# Patient Record
Sex: Male | Born: 1935 | Race: White | Hispanic: No | Marital: Married | Smoking: Never smoker
Health system: Southern US, Community
[De-identification: ages and names within clinical notes are randomized; demographics above are authoritative.]

## PROBLEM LIST (undated history)

## (undated) DIAGNOSIS — A419 Sepsis, unspecified organism: Secondary | ICD-10-CM

## (undated) DIAGNOSIS — K219 Gastro-esophageal reflux disease without esophagitis: Secondary | ICD-10-CM

## (undated) DIAGNOSIS — E039 Hypothyroidism, unspecified: Secondary | ICD-10-CM

## (undated) DIAGNOSIS — G4730 Sleep apnea, unspecified: Secondary | ICD-10-CM

## (undated) DIAGNOSIS — Z950 Presence of cardiac pacemaker: Secondary | ICD-10-CM

## (undated) DIAGNOSIS — I10 Essential (primary) hypertension: Secondary | ICD-10-CM

## (undated) DIAGNOSIS — I499 Cardiac arrhythmia, unspecified: Secondary | ICD-10-CM

## (undated) DIAGNOSIS — Z95810 Presence of automatic (implantable) cardiac defibrillator: Secondary | ICD-10-CM

## (undated) DIAGNOSIS — I4891 Unspecified atrial fibrillation: Secondary | ICD-10-CM

## (undated) DIAGNOSIS — I509 Heart failure, unspecified: Secondary | ICD-10-CM

## (undated) DIAGNOSIS — I639 Cerebral infarction, unspecified: Secondary | ICD-10-CM

## (undated) HISTORY — PX: OTHER SURGICAL HISTORY: SHX169

---

## 2017-03-22 DIAGNOSIS — A419 Sepsis, unspecified organism: Secondary | ICD-10-CM

## 2017-03-22 HISTORY — DX: Sepsis, unspecified organism: A41.9

## 2017-03-28 ENCOUNTER — Ambulatory Visit (HOSPITAL_COMMUNITY): Payer: BLUE CROSS/BLUE SHIELD | Admitting: Registered Nurse

## 2017-03-28 ENCOUNTER — Encounter (HOSPITAL_COMMUNITY)
Admission: RE | Disposition: A | Discharge status: Home / Self Care | Payer: Self-pay | Source: Ambulatory Visit | Attending: Gastroenterology

## 2017-03-28 ENCOUNTER — Ambulatory Visit (HOSPITAL_COMMUNITY)
Admission: RE | Admit: 2017-03-28 | Discharge: 2017-03-28 | Disposition: A | Discharge status: Home / Self Care | Payer: BLUE CROSS/BLUE SHIELD | Source: Ambulatory Visit | Attending: Gastroenterology | Admitting: Gastroenterology

## 2017-03-28 ENCOUNTER — Other Ambulatory Visit: Payer: Self-pay | Admitting: Gastroenterology

## 2017-03-28 ENCOUNTER — Other Ambulatory Visit: Payer: Self-pay

## 2017-03-28 ENCOUNTER — Ambulatory Visit (HOSPITAL_COMMUNITY): Payer: BLUE CROSS/BLUE SHIELD

## 2017-03-28 ENCOUNTER — Encounter (HOSPITAL_COMMUNITY): Payer: Self-pay | Admitting: *Deleted

## 2017-03-28 DIAGNOSIS — E039 Hypothyroidism, unspecified: Secondary | ICD-10-CM | POA: Insufficient documentation

## 2017-03-28 DIAGNOSIS — I4891 Unspecified atrial fibrillation: Secondary | ICD-10-CM | POA: Diagnosis not present

## 2017-03-28 DIAGNOSIS — I472 Ventricular tachycardia: Secondary | ICD-10-CM | POA: Insufficient documentation

## 2017-03-28 DIAGNOSIS — I110 Hypertensive heart disease with heart failure: Secondary | ICD-10-CM | POA: Insufficient documentation

## 2017-03-28 DIAGNOSIS — G4730 Sleep apnea, unspecified: Secondary | ICD-10-CM | POA: Diagnosis not present

## 2017-03-28 DIAGNOSIS — Z950 Presence of cardiac pacemaker: Secondary | ICD-10-CM | POA: Insufficient documentation

## 2017-03-28 DIAGNOSIS — I509 Heart failure, unspecified: Secondary | ICD-10-CM | POA: Insufficient documentation

## 2017-03-28 DIAGNOSIS — K8030 Calculus of bile duct with cholangitis, unspecified, without obstruction: Secondary | ICD-10-CM | POA: Insufficient documentation

## 2017-03-28 DIAGNOSIS — Z79890 Hormone replacement therapy (postmenopausal): Secondary | ICD-10-CM | POA: Diagnosis not present

## 2017-03-28 DIAGNOSIS — K219 Gastro-esophageal reflux disease without esophagitis: Secondary | ICD-10-CM | POA: Diagnosis not present

## 2017-03-28 DIAGNOSIS — Z8673 Personal history of transient ischemic attack (TIA), and cerebral infarction without residual deficits: Secondary | ICD-10-CM | POA: Insufficient documentation

## 2017-03-28 DIAGNOSIS — K831 Obstruction of bile duct: Secondary | ICD-10-CM

## 2017-03-28 DIAGNOSIS — Z9889 Other specified postprocedural states: Secondary | ICD-10-CM | POA: Insufficient documentation

## 2017-03-28 DIAGNOSIS — Z6830 Body mass index (BMI) 30.0-30.9, adult: Secondary | ICD-10-CM | POA: Insufficient documentation

## 2017-03-28 DIAGNOSIS — Z95810 Presence of automatic (implantable) cardiac defibrillator: Secondary | ICD-10-CM | POA: Diagnosis not present

## 2017-03-28 DIAGNOSIS — Z79899 Other long term (current) drug therapy: Secondary | ICD-10-CM | POA: Insufficient documentation

## 2017-03-28 HISTORY — PX: ERCP: SHX5425

## 2017-03-28 HISTORY — DX: Gastro-esophageal reflux disease without esophagitis: K21.9

## 2017-03-28 HISTORY — DX: Cardiac arrhythmia, unspecified: I49.9

## 2017-03-28 HISTORY — DX: Unspecified atrial fibrillation: I48.91

## 2017-03-28 HISTORY — DX: Sleep apnea, unspecified: G47.30

## 2017-03-28 HISTORY — DX: Heart failure, unspecified: I50.9

## 2017-03-28 HISTORY — DX: Presence of automatic (implantable) cardiac defibrillator: Z95.810

## 2017-03-28 HISTORY — DX: Essential (primary) hypertension: I10

## 2017-03-28 HISTORY — DX: Presence of cardiac pacemaker: Z95.0

## 2017-03-28 HISTORY — DX: Cerebral infarction, unspecified: I63.9

## 2017-03-28 HISTORY — DX: Hypothyroidism, unspecified: E03.9

## 2017-03-28 HISTORY — DX: Sepsis, unspecified organism: A41.9

## 2017-03-28 SURGERY — ERCP, WITH INTERVENTION IF INDICATED
Anesthesia: General

## 2017-03-28 MED ORDER — SODIUM CHLORIDE 0.9 % IV SOLN
1.0000 g | Freq: Once | INTRAVENOUS | Status: AC
  Administered 2017-03-28: 1 g via INTRAVENOUS
  Filled 2017-03-28: qty 1

## 2017-03-28 MED ORDER — LIDOCAINE HCL (CARDIAC) 20 MG/ML IV SOLN
INTRAVENOUS | Status: DC | PRN
  Administered 2017-03-28: 20 mg via INTRAVENOUS

## 2017-03-28 MED ORDER — DEXAMETHASONE SODIUM PHOSPHATE 10 MG/ML IJ SOLN
INTRAMUSCULAR | Status: DC | PRN
  Administered 2017-03-28: 5 mg via INTRAVENOUS

## 2017-03-28 MED ORDER — HEPARIN SOD (PORK) LOCK FLUSH 100 UNIT/ML IV SOLN
250.0000 [IU] | INTRAVENOUS | Status: AC | PRN
  Administered 2017-03-28: 250 [IU]

## 2017-03-28 MED ORDER — SODIUM CHLORIDE 0.9 % IV SOLN
INTRAVENOUS | Status: DC
  Administered 2017-03-28: 14:00:00 via INTRAVENOUS

## 2017-03-28 MED ORDER — SUFENTANIL CITRATE 50 MCG/ML IV SOLN: INTRAVENOUS | Status: DC | PRN

## 2017-03-28 MED ORDER — ROCURONIUM BROMIDE 100 MG/10ML IV SOLN
INTRAVENOUS | Status: DC | PRN
  Administered 2017-03-28: 50 mg via INTRAVENOUS

## 2017-03-28 MED ORDER — ONDANSETRON HCL 4 MG/2ML IJ SOLN
INTRAMUSCULAR | Status: DC | PRN
  Administered 2017-03-28: 4 mg via INTRAVENOUS

## 2017-03-28 MED ORDER — LACTATED RINGERS IV SOLN
INTRAVENOUS | Status: DC
  Administered 2017-03-28: 1000 mL via INTRAVENOUS

## 2017-03-28 MED ORDER — PROPOFOL 10 MG/ML IV BOLUS
INTRAVENOUS | Status: DC | PRN
  Administered 2017-03-28: 100 mg via INTRAVENOUS

## 2017-03-28 MED ORDER — PROMETHAZINE HCL 25 MG/ML IJ SOLN: 6.2500 mg | INTRAMUSCULAR | Status: DC | PRN

## 2017-03-28 MED ORDER — PHENYLEPHRINE HCL 10 MG/ML IJ SOLN
INTRAVENOUS | Status: DC | PRN
  Administered 2017-03-28: 25 ug/min via INTRAVENOUS

## 2017-03-28 MED ORDER — SUGAMMADEX SODIUM 200 MG/2ML IV SOLN
INTRAVENOUS | Status: DC | PRN
  Administered 2017-03-28: 200 mg via INTRAVENOUS

## 2017-03-28 MED ORDER — MEPERIDINE HCL 25 MG/ML IJ SOLN: 6.2500 mg | INTRAMUSCULAR | Status: DC | PRN

## 2017-03-28 MED ORDER — FENTANYL CITRATE (PF) 100 MCG/2ML IJ SOLN
INTRAMUSCULAR | Status: AC
  Filled 2017-03-28: qty 2

## 2017-03-28 MED ORDER — PROPOFOL 10 MG/ML IV BOLUS
INTRAVENOUS | Status: AC
  Filled 2017-03-28: qty 20

## 2017-03-28 MED ORDER — INDOMETHACIN 50 MG RE SUPP
RECTAL | Status: AC
  Filled 2017-03-28: qty 2

## 2017-03-28 MED ORDER — GLUCAGON HCL RDNA (DIAGNOSTIC) 1 MG IJ SOLR
INTRAMUSCULAR | Status: AC
  Filled 2017-03-28: qty 1

## 2017-03-28 MED ORDER — MIDAZOLAM HCL 5 MG/ML IJ SOLN
0.5000 mg | Freq: Once | INTRAMUSCULAR | Status: DC | PRN
Start: 2017-03-28 — End: 2017-03-28

## 2017-03-28 MED ORDER — FENTANYL CITRATE (PF) 100 MCG/2ML IJ SOLN
INTRAMUSCULAR | Status: DC | PRN
  Administered 2017-03-28: 25 ug via INTRAVENOUS
  Administered 2017-03-28: 50 ug via INTRAVENOUS
  Administered 2017-03-28: 25 ug via INTRAVENOUS

## 2017-03-28 MED ORDER — SODIUM CHLORIDE 0.9 % IV SOLN
INTRAVENOUS | Status: DC | PRN
  Administered 2017-03-28: 50 mL

## 2017-03-28 NOTE — Transfer of Care (Signed)
Immediate Anesthesia Transfer of Care Note  Patient: Drew RoughRobert M Drake  Procedure(s) Performed: ENDOSCOPIC RETROGRADE CHOLANGIOPANCREATOGRAPHY (ERCP) (N/A )  Patient Location: PACU  Anesthesia Type:General  Level of Consciousness: awake, alert , oriented and patient cooperative  Airway & Oxygen Therapy: Patient Spontanous Breathing and Patient connected to face mask oxygen  Post-op Assessment: Report given to RN, Post -op Vital signs reviewed and stable and Patient moving all extremities X 4  Post vital signs: stable  Last Vitals:  Vitals:   03/28/17 1046 03/28/17 1413  BP: (!) 167/91 (!) 170/89  Pulse: 70 73  Resp: 18 18  Temp: 36.5 C   SpO2: 96% 97%    Last Pain:  Vitals:   03/28/17 1413  TempSrc: Oral         Complications: No apparent anesthesia complications

## 2017-03-28 NOTE — Anesthesia Procedure Notes (Signed)
Procedure Name: Intubation Date/Time: 03/28/2017 12:26 PM Performed by: Lissa Morales, CRNA Pre-anesthesia Checklist: Patient identified, Emergency Drugs available, Suction available and Patient being monitored Patient Re-evaluated:Patient Re-evaluated prior to induction Oxygen Delivery Method: Circle system utilized Preoxygenation: Pre-oxygenation with 100% oxygen Induction Type: IV induction Ventilation: Mask ventilation without difficulty Laryngoscope Size: Glidescope, 3 and Mac Grade View: Grade II Tube type: Oral Tube size: 7.5 mm Number of attempts: 1 Airway Equipment and Method: Stylet and Oral airway Placement Confirmation: ETT inserted through vocal cords under direct vision,  positive ETCO2 and breath sounds checked- equal and bilateral Secured at: 21 cm Tube secured with: Tape Dental Injury: Teeth and Oropharynx as per pre-operative assessment  Difficulty Due To: Difficulty was unanticipated, Difficult Airway- due to anterior larynx, Difficult Airway- due to limited oral opening, Difficult Airway- due to dentition and Difficult Airway- due to reduced neck mobility Comments: Elective glidescope,Stated difficulty swallowing after  GA procedure a few days ago

## 2017-03-28 NOTE — Op Note (Signed)
[facility] Patient Name: Drew DuttonRobert Drake Procedure Date: 03/28/2017 MRN: [id number] Attending MD: Vida RiggerMarc Evalina Tabak , MD Date of Birth: 08/24/1929 CSN: [id number] Age: 84 Admit Type: Outpatient Procedure:                ERCP Indications:              Ascending cholangitis Providers:                Vida RiggerMarc Linzie Criss, MD, Autumn Titus DubinGoldsmith RN, RN, Kandice RobinsonsGuillaume                            [place], Technician Referring MD:              Medicines:                General Anesthesia Complications:            No immediate complications. Estimated Blood Loss:     Estimated blood loss: none. Procedure:                Pre-Anesthesia Assessment:                           - Prior to the procedure, a History and Physical                            was performed, and patient medications and                            allergies were reviewed. The patient's tolerance of                            previous anesthesia was also reviewed. The risks                            and benefits of the procedure and the sedation                            options and risks were discussed with the patient.                            All questions were answered, and informed consent                            was obtained. Prior Anticoagulants: The patient has                            taken no previous anticoagulant or antiplatelet                            agents. ASA Grade Assessment: III - A patient with                            severe systemic disease. After reviewing the risks                            and benefits, the  patient was deemed in                            satisfactory condition to undergo the procedure.                           After obtaining informed consent, the scope was                            passed under direct vision. Throughout the                            procedure, the patient's blood pressure, pulse, and                            oxygen saturations were monitored continuously.  The                            was introduced through the mouth, and used to                            inject contrast into and used to cannulate the bile                            duct. The ERCP was technically difficult and                            complex due to abnormal anatomy. Successful                            completion of the procedure was aided by performing                            the maneuvers documented (below) in this report.                            The patient tolerated the procedure well. Scope In: Scope Out: Findings:      The major papilla was located entirely within a diverticulum. We were       unable to find the ostia and we did probe the ampulla multiple times and       tried to use the sphincterotome to change its position but we did see       bile spurt a few times which confirmed our anatomy and we proceeded with       a A biliary pre-cut sphincterotomy was made with a needle knife using a       freehand technique using ERBE electrocautery. Deep selective cannulation       was readily obtained and the wire was advanced into the intrahepatics       and dye was injected which confirmed sludge and stones and a dilated       CBD. There was no post-sphincterotomy bleeding. We then exchanged the       needle-knife for the customary sphincterotome and The biliary       sphincterotomy was extended with a Hydratome  sphincterotome using ERBE       electrocautery. There was no post-sphincterotomy bleeding. The common       bile duct was moderately dilated, with a stone causing an obstruction.       The largest duct diameter was 15 mm. The common bile duct contained       multiple stones and debris the largest of which was 10 mm in diameter.       To discover objects, the biliary tree was swept with an adjustable12- 15       mm balloon starting at the bifurcation. Sludge was swept from the duct.       All stones were removed by the end of the procedure. Nothing  was found       on multiple balloon pull-through second of the procedure as well as on       occlusion cholangiogram. Dilation of the common bile duct with a 4 cm by       10 mm balloon dilator was successful. Near the end of the procedure       because of decreased biliary drainage to remove the largest stone which       could not be swept from the duct we advanced the 2.5 cm basket starting       at the middle third of the main bile duct. And the stone was grabbed and       removed Sludge was also swept from the duct. A few stones were removed.       A few stones remained. We then swept the duct a few more times and after       the dilation the 15 mm balloon passed readily through the duct without       much resistance and no residual filling defects on occlusion       cholangiogram at the end of the procedure Impression:               - The major papilla was located entirely within a                            diverticulum.                           - The common bile duct was moderately dilated, with                            a stone causing an obstruction.                           - Choledocholithiasis was found. Partial removal                            was accomplished by biliary sphincterotomy; no                            stent was inserted.                           - A needle-knife biliary sphincterotomy was                            performed.                           -  A conventional biliary sphincterotomy was also                            performed. As above                           - The biliary tree was swept and nothing was found                            at the end of the procedure.                           - Common bile duct was successfully dilated.                           - The biliary tree was swept with multiple 12 and                            15 mm balloon pull-throughs. As above Moderate Sedation:      N/A- Per Anesthesia Care Recommendation:            - Avoid aspirin and nonsteroidal anti-inflammatory                            medicines for 3 days.                           - Clear liquid diet for 6 hours.                           - Continue present medications.                           - Return to GI clinic PRN.                           - Telephone GI clinic if symptomatic PRN.                           - Check liver enzymes (AST, ALT, alkaline                            phosphatase, bilirubin) in 2 days. Will also                            recheck CBC and liver function and will discuss                            antibiotic choice with infectious disease Procedure Code(s):        --- Professional ---                           [phone], 59, Endoscopic retrograde                            cholangiopancreatography (ERCP);  with                            trans-endoscopic balloon dilation of                            biliary/pancreatic duct(s) or of ampulla                            (sphincteroplasty), including sphincterotomy, when                            performed, each duct                           43264, Endoscopic retrograde                            cholangiopancreatography (ERCP); with removal of                            calculi/debris from biliary/pancreatic duct(s) Diagnosis Code(s):        --- Professional ---                           K80.31, Calculus of bile duct with cholangitis,                            unspecified, with obstruction                           K80.30, Calculus of bile duct with cholangitis,                            unspecified, without obstruction                           K83.0, Cholangitis CPT copyright 2016 American [employer]. All rights reserved. The codes documented in this report are preliminary and upon coder review may  be revised to meet current compliance requirements. Vida Rigger, MD 03/28/2017 2:21:12 PM This report has been signed electronically. Number of Addenda:  0

## 2017-03-28 NOTE — Progress Notes (Signed)
Cbc 03-22-17, bmet 03-26-17 chest xray 1 view 03-25-17 on chart

## 2017-03-28 NOTE — Discharge Instructions (Signed)
Call if question or problem otherwise clear liquid diet today and if doing well at 8 PM may have soft solids otherwise soft solids tomorrow if okay and may advance on Friday and call my nurse Britta MccreedyBarbara to set up repeat lab work on Friday morning and specifically call if signs of GI bleeding black diarrhea fever pain nausea vomiting etc.  YOU HAD AN ENDOSCOPIC PROCEDURE TODAY: Refer to the procedure report and other information in the discharge instructions given to you for any specific questions about what was found during the examination. If this information does not answer your questions, please call [facility] office at [phone] to clarify.   YOU SHOULD EXPECT: Some feelings of bloating in the abdomen. Passage of more gas than usual. Walking can help get rid of the air that was put into your GI tract during the procedure and reduce the bloating. If you had a lower endoscopy (such as a colonoscopy or flexible sigmoidoscopy) you may notice spotting of blood in your stool or on the toilet paper. Some abdominal soreness may be present for a day or two, also.  DIET: Your first meal following the procedure should be a light meal and then it is ok to progress to your normal diet. A half-sandwich or bowl of soup is an example of a good first meal. Heavy or fried foods are harder to digest and may make you feel nauseous or bloated. Drink plenty of fluids but you should avoid alcoholic beverages for 24 hours. If you had a esophageal dilation, please see attached instructions for diet.   ACTIVITY: Your care partner should take you home directly after the procedure. You should plan to take it easy, moving slowly for the rest of the day. You can resume normal activity the day after the procedure however YOU SHOULD NOT DRIVE, use power tools, machinery or perform tasks that involve climbing or major physical exertion for 24 hours (because of the sedation medicines used during the test).   SYMPTOMS TO REPORT  IMMEDIATELY: A gastroenterologist can be reached at any hour. Please call [phone]  for any of the following symptoms:  Following lower endoscopy (colonoscopy, flexible sigmoidoscopy) Excessive amounts of blood in the stool  Significant tenderness, worsening of abdominal pains  Swelling of the abdomen that is new, acute  Fever of 100 or higher  Following upper endoscopy (EGD, EUS, ERCP, esophageal dilation) Vomiting of blood or coffee ground material  New, significant abdominal pain  New, significant chest pain or pain under the shoulder blades  Painful or persistently difficult swallowing  New shortness of breath  Black, tarry-looking or red, bloody stools  FOLLOW UP:  If any biopsies were taken you will be contacted by phone or by letter within the next 1-3 weeks. Call [phone]  if you have not heard about the biopsies in 3 weeks.  Please also call with any specific questions about appointments or follow up tests.

## 2017-03-28 NOTE — Anesthesia Preprocedure Evaluation (Addendum)
Anesthesia Evaluation  Patient identified by MRN, date of birth, ID band Patient awake    Reviewed: Allergy & Precautions, NPO status , Patient's Chart, lab work & pertinent test results, reviewed documented beta blocker date and time   History of Anesthesia Complications Negative for: history of anesthetic complications  Airway Mallampati: II  TM Distance: >3 FB Neck ROM: Full    Dental  (+) Caps, Dental Advisory Given   Pulmonary sleep apnea and Continuous Positive Airway Pressure Ventilation ,    breath sounds clear to auscultation       Cardiovascular hypertension, Pt. on medications and Pt. on home beta blockers (-) angina(-) CAD + dysrhythmias Atrial Fibrillation + pacemaker + Cardiac Defibrillator (biotronic, has not shocked patient since insertion)  Rhythm:Irregular Rate:Normal + Systolic murmurs 78/29/5612/28/18 ECHO: EF 35%, mild-mod aortic stenosis, mild-mod reduced RV function  Pt reports cardiac cath x2: no blockages   Neuro/Psych CVA (R sided weakness), Residual Symptoms    GI/Hepatic GERD  Controlled,Elevated LFTs with acute chole   Endo/Other  Hypothyroidism Morbid obesity  Renal/GU negative Renal ROS     Musculoskeletal  (+) Arthritis ,   Abdominal (+) + obese,   Peds  Hematology   Anesthesia Other Findings   Reproductive/Obstetrics                            Anesthesia Physical Anesthesia Plan  ASA: III  Anesthesia Plan: General   Post-op Pain Management:    Induction: Intravenous  PONV Risk Score and Plan: Treatment may vary due to age or medical condition, Ondansetron and Dexamethasone  Airway Management Planned: Oral ETT  Additional Equipment:   Intra-op Plan:   Post-operative Plan: Extubation in OR  Informed Consent: I have reviewed the patients History and Physical, chart, labs and discussed the procedure including the risks, benefits and alternatives for the  proposed anesthesia with the patient or authorized representative who has indicated his/her understanding and acceptance.   Dental advisory given  Plan Discussed with: CRNA and Surgeon  Anesthesia Plan Comments: (Plan routine monitors, GETA)        Anesthesia Quick Evaluation

## 2017-03-28 NOTE — H&P (Signed)
Drew Drake is an 84 y.o. male.   Chief Complaint: Resolving cholangitis HPI: Patient known to me from being the father of a good friend who had his gallbladder out 15 years ago which was an open procedure and while on vacation developed heart failure and sepsis and was found to have elevated liver tests and probable CBD stones or sludge on CT scan and there were unable to do an MRCP due to his pacemaker defibrillator and unfortunately an ERCP was unsuccessful and they debated whether to do a PTC or not but when he got better on antibiotics he was decided to continue antibiotics and retry the procedure here and he has not had any abdominal pain and is feeling much better and shortness of breath has resolved and he has no other complaints  Past Medical History:  Diagnosis Date  . AICD (automatic cardioverter/defibrillator) present   . Atrial fibrillation ([place])   . CHF (congestive heart failure) ([place])   . Dysrhythmia    afib and ventricular tachycardia  . GERD (gastroesophageal reflux disease)   . Hypertension   . Hypothyroidism   . Presence of permanent cardiac pacemaker   . Sepsis ([place]) 03/22/2017  . Sleep apnea   . Stroke [facility])     Past Surgical History:  Procedure Laterality Date  . ablation for afib      History reviewed. No pertinent family history. Social History:  reports that  has never smoked. he has never used smokeless tobacco. He reports that he drinks alcohol. He reports that he does not use drugs.  Allergies: No Known Allergies  Medications Prior to Admission  Medication Sig Dispense Refill  . albuterol (PROVENTIL HFA;VENTOLIN HFA) 108 (90 Base) MCG/ACT inhaler Inhale 2 puffs into the lungs every 6 (six) hours as needed for wheezing or shortness of breath.    . bisoprolol (ZEBETA) 5 MG tablet Take 5 mg by mouth 2 (two) times daily.    . Cholecalciferol 1000 units tablet Take 1,000 Units by mouth daily.    . cyanocobalamin 100 MCG tablet Take 100 mcg by mouth every  evening.    Marland Kitchen esomeprazole (NEXIUM) 20 MG capsule Take 10 mg by mouth daily at 12 noon.    . flecainide (TAMBOCOR) 50 MG tablet Take 50 mg by mouth 2 (two) times daily.    Javier Docker Oil 300 MG CAPS Take 600 mg by mouth 2 (two) times daily.    Marland Kitchen MAGNESIUM GLYCINATE PLUS PO Take 1,000 mg by mouth 2 (two) times daily.    Marland Kitchen mexiletine (MEXITIL) 200 MG capsule Take 200 mg by mouth 2 (two) times daily.    . Multiple Vitamin (MULTIVITAMIN WITH MINERALS) TABS tablet Take 1 tablet by mouth daily.    . sildenafil (VIAGRA) 50 MG tablet Take 50 mg by mouth as needed for erectile dysfunction.    . simvastatin (ZOCOR) 5 MG tablet Take 5 mg by mouth at bedtime.    Marland Kitchen spironolactone (ALDACTONE) 25 MG tablet Take 25 mg by mouth daily.    Marland Kitchen thyroid (ARMOUR) 32.5 MG tablet Take 32.5 mg by mouth daily.    . valsartan (DIOVAN) 40 MG tablet Take 40 mg by mouth daily.      No results found for this or any previous visit (from the past 48 hour(s)). No results found.  ROS negative except above we did discuss his [place] admission on his last trip to [place]  Blood pressure (!) 167/91, pulse 70, temperature 97.7 F (36.5 C), temperature source  Oral, resp. rate 18, height 5' 9.5" (1.765 m), weight 95.3 kg (210 lb), SpO2 96 %. Physical Exam vital signs stable afebrile no acute distress exam please see preassessment evaluation yesterday's labs reviewed mildly elevated liver tests with normal bili and white count other records reviewed  Assessment/Plan Probable resolving cholangitis secondary to CBD sludge or stone Plan: The risks benefits methods and success rate of ERCP was discussed with the patient including possible stenting and will proceed with anesthesia assistance with further workup plans and recommendations pending those findings  [facility], MD 03/28/2017, 11:26 AM

## 2017-03-28 NOTE — Anesthesia Postprocedure Evaluation (Signed)
Anesthesia Post Note  Patient: Drew Drake  Procedure(s) Performed: ENDOSCOPIC RETROGRADE CHOLANGIOPANCREATOGRAPHY (ERCP) (N/A )     Patient location during evaluation: Endoscopy Anesthesia Type: General Level of consciousness: awake and alert, patient cooperative and oriented Pain management: pain level controlled Vital Signs Assessment: post-procedure vital signs reviewed and stable Respiratory status: spontaneous breathing, nonlabored ventilation and respiratory function stable Cardiovascular status: blood pressure returned to baseline and stable Postop Assessment: no apparent nausea or vomiting Anesthetic complications: no    Last Vitals:  Vitals:   03/28/17 1435 03/28/17 1440  BP:  (!) 161/87  Pulse:  70  Resp:  17  Temp:    SpO2: 96% 94%    Last Pain:  Vitals:   03/28/17 1413  TempSrc: Axillary                 [name],E. Jamyrah Saur

## 2017-03-29 NOTE — Addendum Note (Signed)
Addendum  created 03/29/17 0612 by Elyn PeersAllen, Hasan Douse J, CRNA   Charge Capture section accepted

## 2017-03-30 ENCOUNTER — Encounter (HOSPITAL_COMMUNITY): Payer: Self-pay | Admitting: Gastroenterology
